# Patient Record
Sex: Male | Born: 1935 | Hispanic: No | Marital: Single
Health system: Southern US, Community
[De-identification: ages and names within clinical notes are randomized; demographics above are authoritative.]

---

## 2009-02-13 ENCOUNTER — Ambulatory Visit: Payer: Self-pay | Admitting: Family Medicine

## 2009-02-13 ENCOUNTER — Inpatient Hospital Stay: Payer: Self-pay | Admitting: Internal Medicine

## 2009-02-15 ENCOUNTER — Ambulatory Visit: Payer: Self-pay | Admitting: Cardiology

## 2009-03-15 ENCOUNTER — Inpatient Hospital Stay: Payer: Self-pay | Admitting: Internal Medicine

## 2009-08-27 ENCOUNTER — Ambulatory Visit: Payer: Self-pay | Admitting: Family Medicine

## 2009-09-24 ENCOUNTER — Inpatient Hospital Stay: Payer: Self-pay | Admitting: Internal Medicine

## 2010-06-09 ENCOUNTER — Encounter: Payer: Self-pay | Admitting: Neurology

## 2010-06-28 ENCOUNTER — Encounter: Payer: Self-pay | Admitting: Neurology

## 2011-07-24 ENCOUNTER — Ambulatory Visit: Payer: Self-pay | Admitting: Family Medicine

## 2012-09-09 ENCOUNTER — Inpatient Hospital Stay: Payer: Self-pay | Admitting: Internal Medicine

## 2012-09-09 LAB — COMPREHENSIVE METABOLIC PANEL
Alkaline Phosphatase: 135 U/L (ref 50–136)
BUN: 21 mg/dL — ABNORMAL HIGH (ref 7–18)
Bilirubin,Total: 0.5 mg/dL (ref 0.2–1.0)
Calcium, Total: 8.4 mg/dL — ABNORMAL LOW (ref 8.5–10.1)
Chloride: 112 mmol/L — ABNORMAL HIGH (ref 98–107)
Co2: 22 mmol/L (ref 21–32)
Creatinine: 1.53 mg/dL — ABNORMAL HIGH (ref 0.60–1.30)
EGFR (African American): 49 — ABNORMAL LOW
EGFR (Non-African Amer.): 42 — ABNORMAL LOW
Osmolality: 289 (ref 275–301)
Potassium: 4.1 mmol/L (ref 3.5–5.1)
SGOT(AST): 25 U/L (ref 15–37)
SGPT (ALT): 17 U/L (ref 12–78)

## 2012-09-09 LAB — CBC WITH DIFFERENTIAL/PLATELET
Basophil #: 0 10*3/uL (ref 0.0–0.1)
Eosinophil %: 0.5 %
HCT: 40.8 % (ref 40.0–52.0)
HGB: 13.5 g/dL (ref 13.0–18.0)
MCV: 85 fL (ref 80–100)
Monocyte #: 0.4 x10 3/mm (ref 0.2–1.0)
Monocyte %: 7 %
Neutrophil #: 4.3 10*3/uL (ref 1.4–6.5)
Neutrophil %: 79.1 %
Platelet: 148 10*3/uL — ABNORMAL LOW (ref 150–440)
RBC: 4.8 10*6/uL (ref 4.40–5.90)
RDW: 14.8 % — ABNORMAL HIGH (ref 11.5–14.5)

## 2012-09-09 LAB — TROPONIN I
Troponin-I: 0.07 ng/mL — ABNORMAL HIGH
Troponin-I: 0.14 ng/mL — ABNORMAL HIGH

## 2012-09-09 LAB — APTT: Activated PTT: 28.6 secs (ref 23.6–35.9)

## 2012-09-09 LAB — PROTIME-INR: Prothrombin Time: 13.7 secs (ref 11.5–14.7)

## 2012-09-09 LAB — CK TOTAL AND CKMB (NOT AT ARMC)
CK, Total: 119 U/L (ref 35–232)
CK-MB: 2.8 ng/mL (ref 0.5–3.6)

## 2012-09-11 LAB — CBC WITH DIFFERENTIAL/PLATELET
Basophil %: 0.3 %
Eosinophil #: 0 10*3/uL (ref 0.0–0.7)
Eosinophil %: 1.1 %
HGB: 12.3 g/dL — ABNORMAL LOW (ref 13.0–18.0)
Lymphocyte %: 18.9 %
Neutrophil %: 71 %
RBC: 4.45 10*6/uL (ref 4.40–5.90)
WBC: 3.6 10*3/uL — ABNORMAL LOW (ref 3.8–10.6)

## 2012-09-11 LAB — BASIC METABOLIC PANEL
BUN: 16 mg/dL (ref 7–18)
Calcium, Total: 8 mg/dL — ABNORMAL LOW (ref 8.5–10.1)
Co2: 25 mmol/L (ref 21–32)
EGFR (Non-African Amer.): 47 — ABNORMAL LOW
Glucose: 76 mg/dL (ref 65–99)
Osmolality: 283 (ref 275–301)
Sodium: 142 mmol/L (ref 136–145)

## 2013-10-21 IMAGING — CT CT HEAD WITHOUT CONTRAST
2 series · 15 of 30 positions shown, 19 images · non-contrast
Comparison: none

REASON FOR EXAM: cr [id number]  disorientation trouble walking
COMMENTS:

PROCEDURE:     CT  - CT HEAD WITHOUT CONTRAST  - July 24, 2011 [DATE]
RESULT:     Comparison:  09/24/2009
TECHNIQUE: Multiple axial images from the foramen magnum to the vertex were
obtained without IV contrast.

[Series 2: without · axial · non-contrast · 0.45mm/px · z∈[-110,+10]mm · 13 of 30 slices shown, 17 images]
[im 3/30  brain]
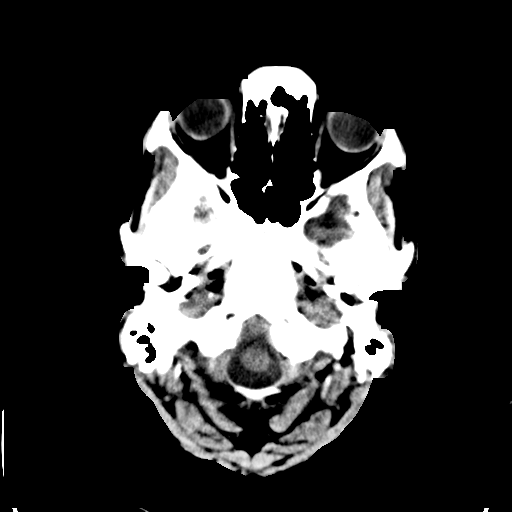
[im 3/30  bone]
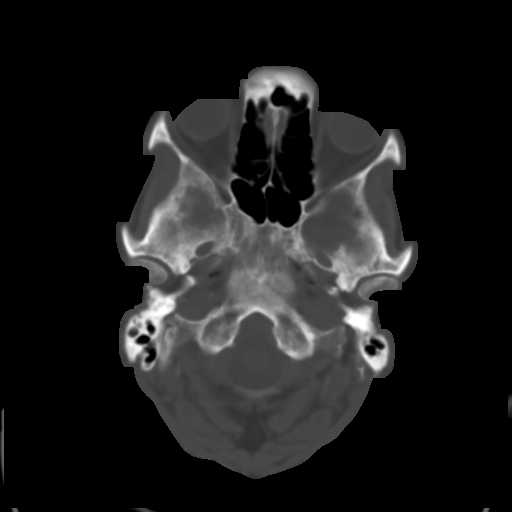
[im 5/30  brain]
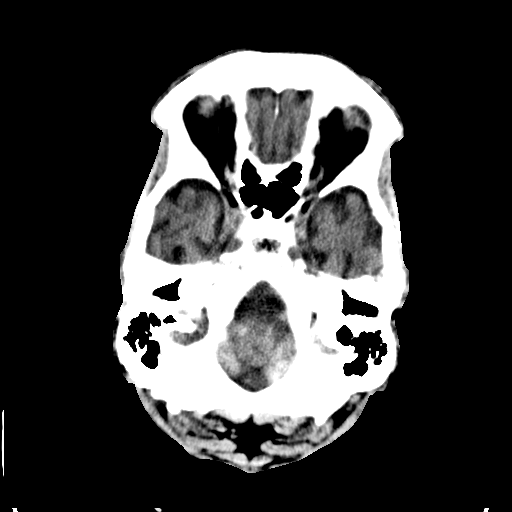
[im 7/30  brain]
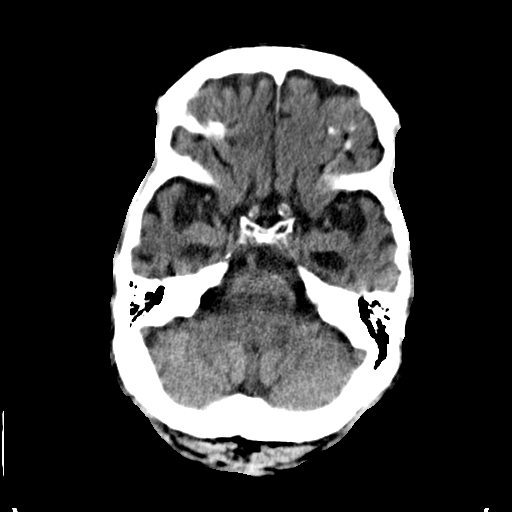
[im 9/30  brain]
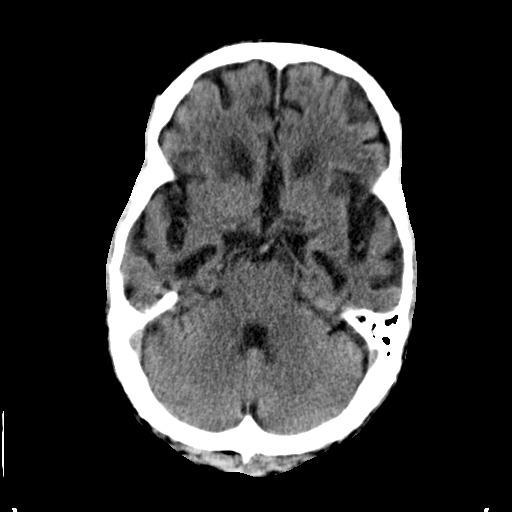
[im 11/30  brain]
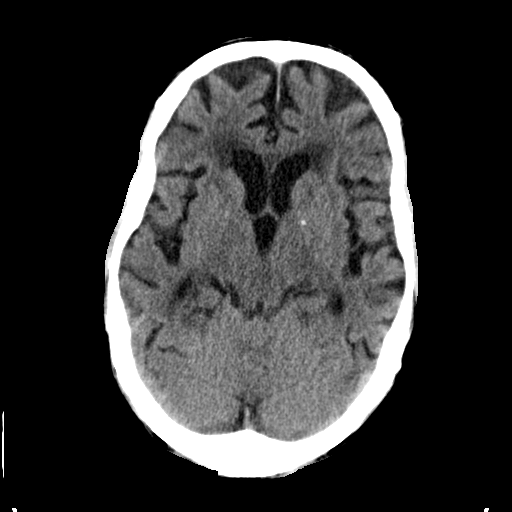
[im 11/30  bone]
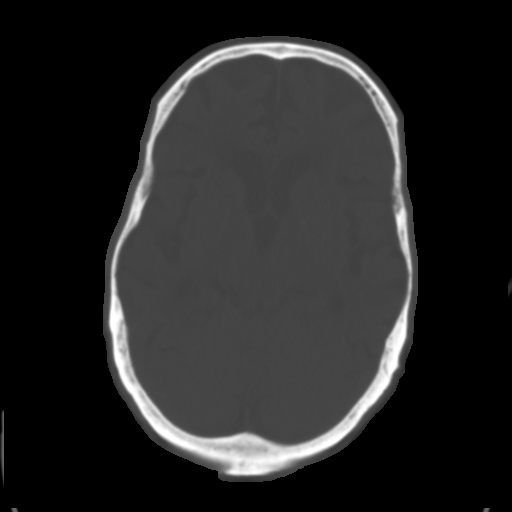
[im 13/30  brain]
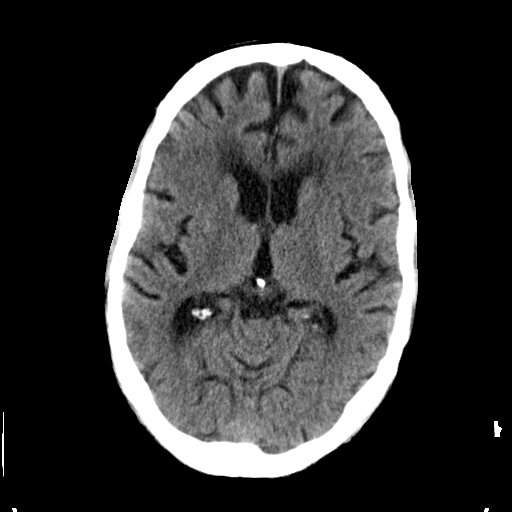
[im 15/30  brain]
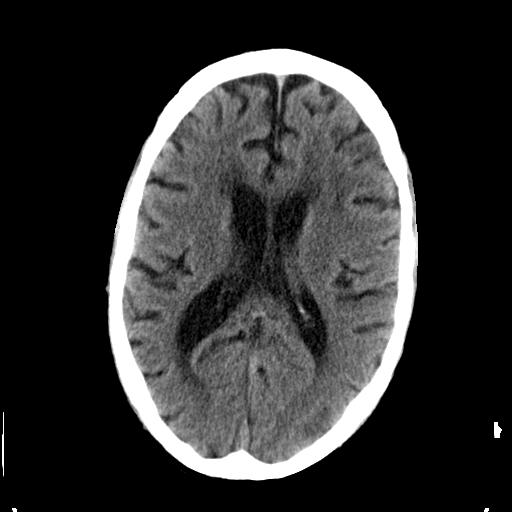
[im 17/30  brain]
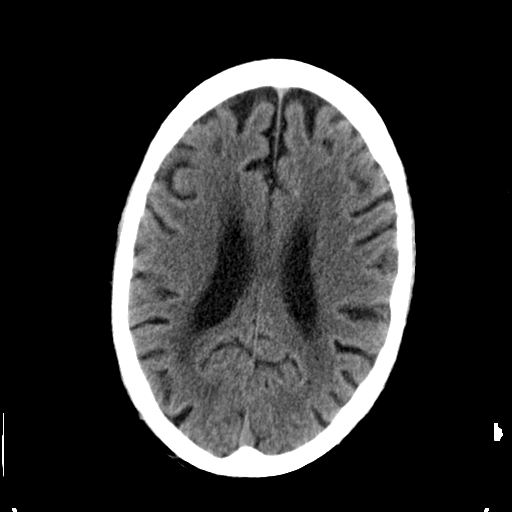
[im 19/30  brain]
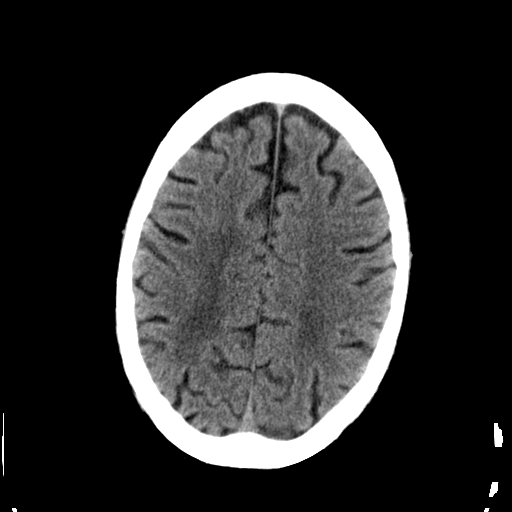
[im 19/30  bone]
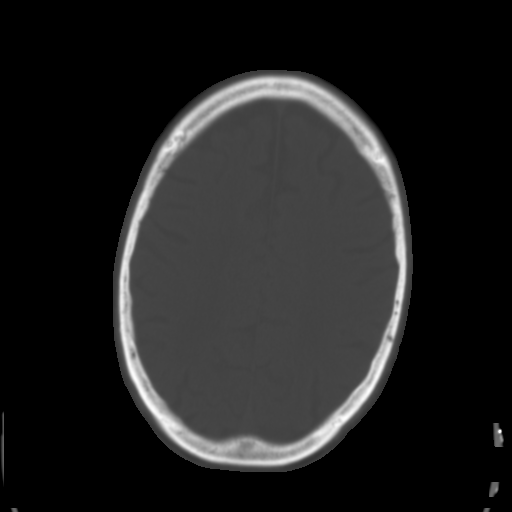
[im 21/30  brain]
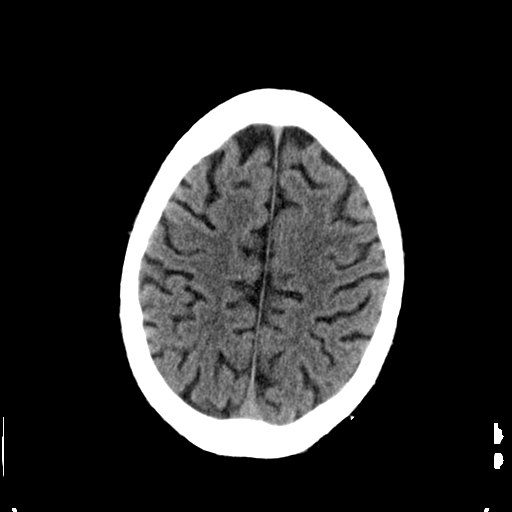
[im 23/30  brain]
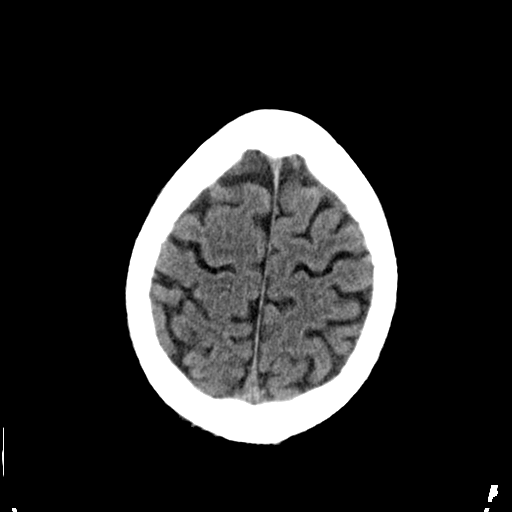
[im 25/30  brain]
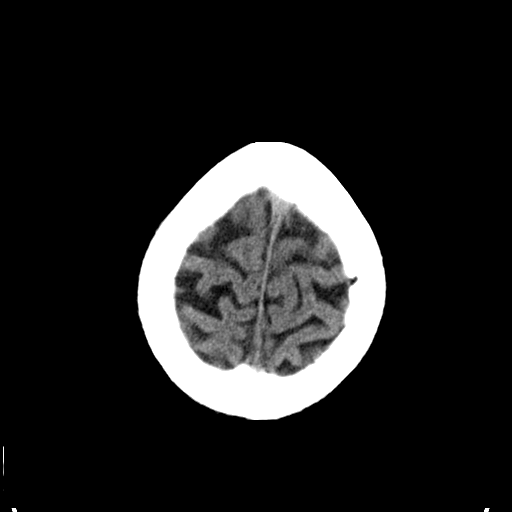
[im 27/30  brain]
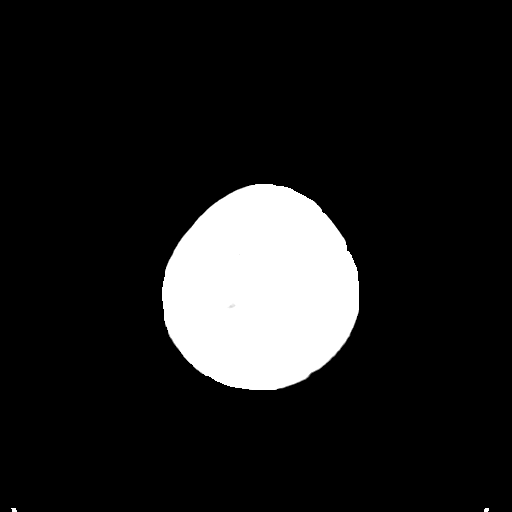
[im 27/30  bone]
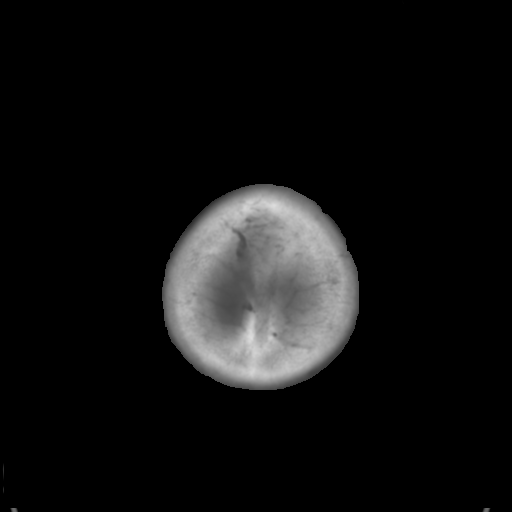

[Series 3: bone · axial · 0.45mm/px · z∈[-110,-90]mm · 2 of 30 slices shown]
[im 3/30  bone]
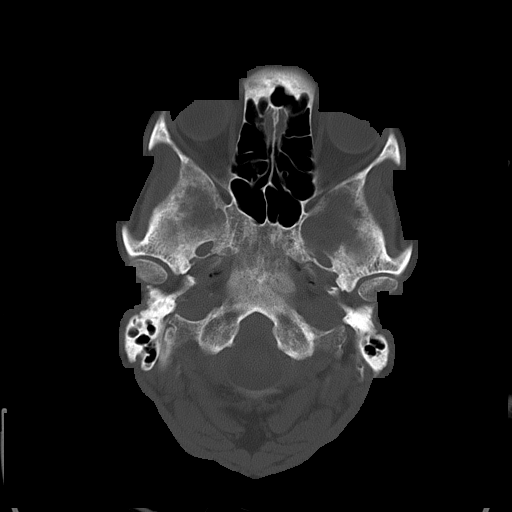
[im 7/30  bone]
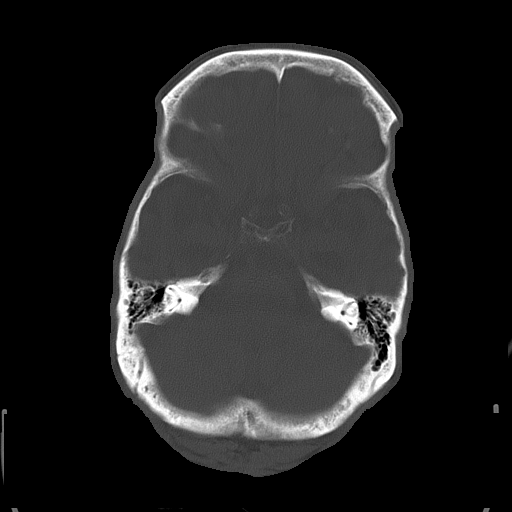

[15 of 30 positions shown; findings below may reference images not displayed]

FINDINGS: There is no evidence for mass effect, midline shift, or extra-axial fluid
collections. There is no evidence for space-occupying lesion, intracranial
hemorrhage, or cortical-based area of infarction. Periventricular and
subcortical hypoattenuation is consistent with chronic small vessel ischemic
disease.

The osseous structures are unremarkable.
IMPRESSION: 1. No acute intracranial process.
2. Chronic small vessel ischemic disease.

CT can underestimate ischemia in the first 24 hours after the event. If
there is clinical concern for an acute infarct, a followup MRI or repeat CT
scan in 24 hours may provide additional information.

## 2013-10-21 IMAGING — CR DG HUMERUS 2V *R*
1 series · 3 of 3 positions shown · non-contrast
Comparison: none

REASON FOR EXAM: right shoulder pain
COMMENTS:

PROCEDURE:     DXR - DXR HUMERUS RIGHT  - July 24, 2011 [DATE]
RESULT:     Right humerus images demonstrate a complex comminuted fracture
in the proximal humeral shaft and surgical neck region. Slight distraction
is present.

[Series 1: t humerus ap right · 0.14mm/px · 3 of 3 slices shown]
[im 1/3]
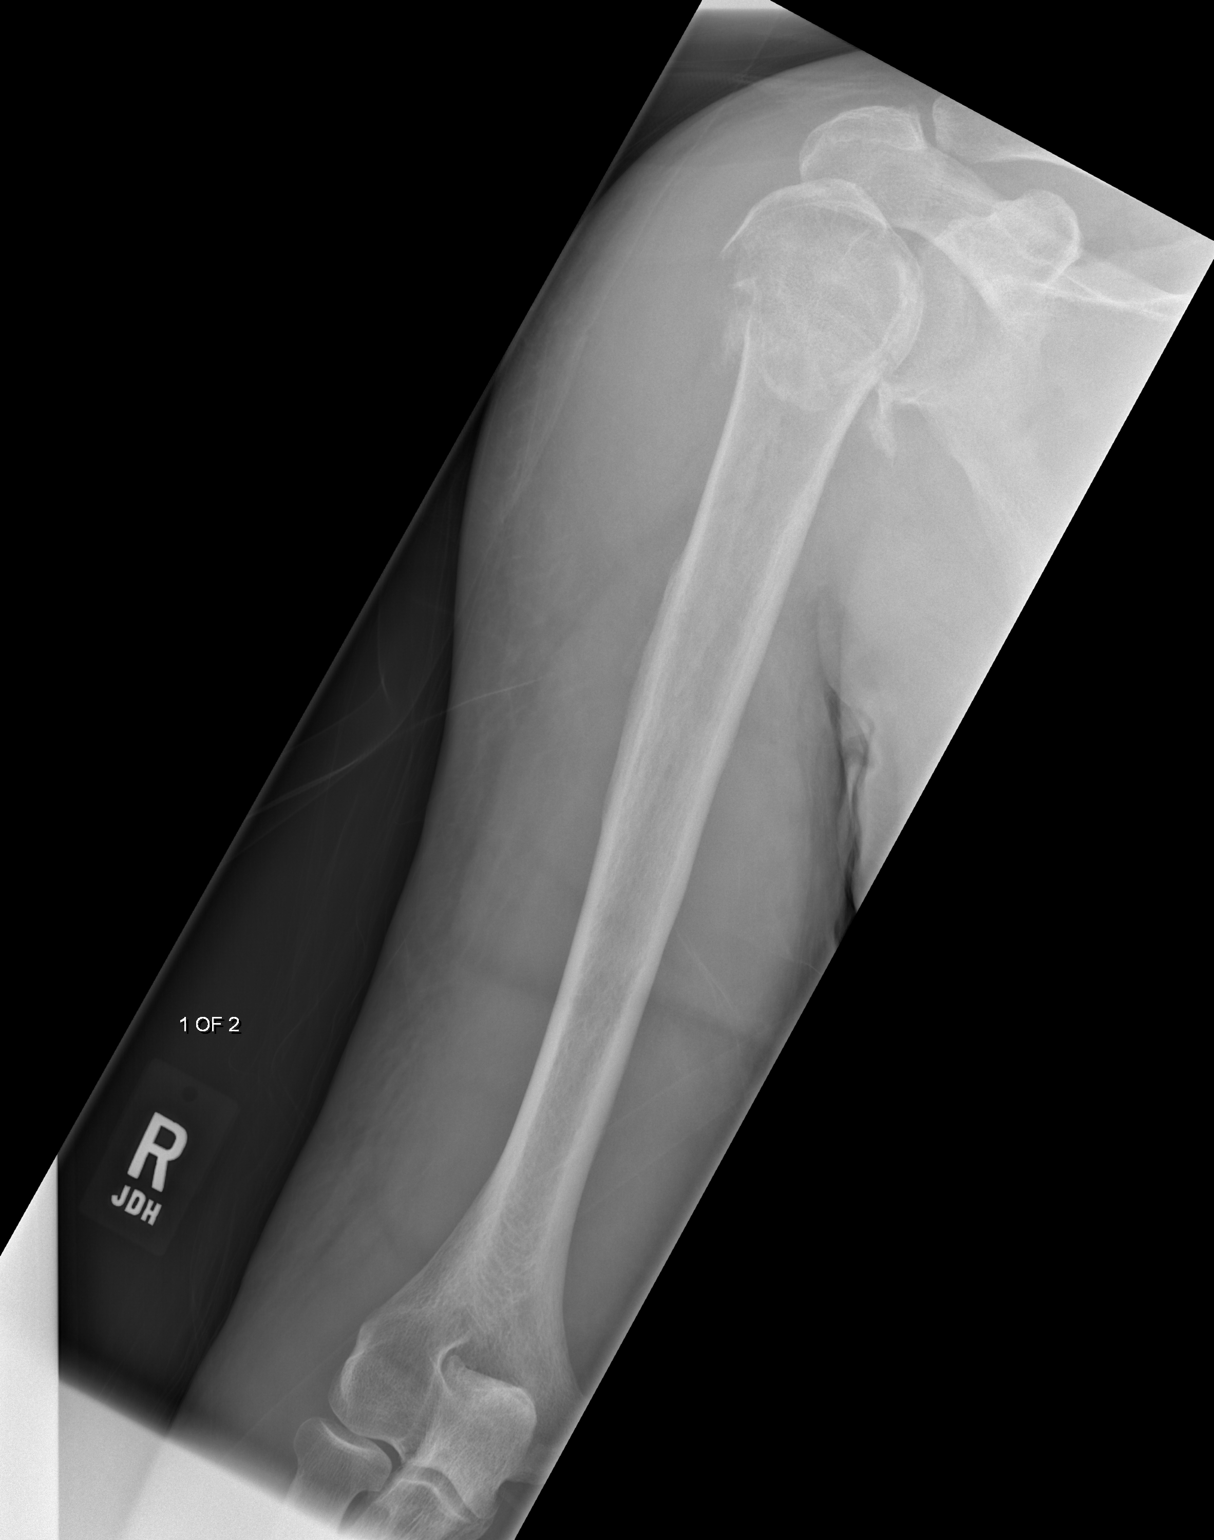
[im 2/3]
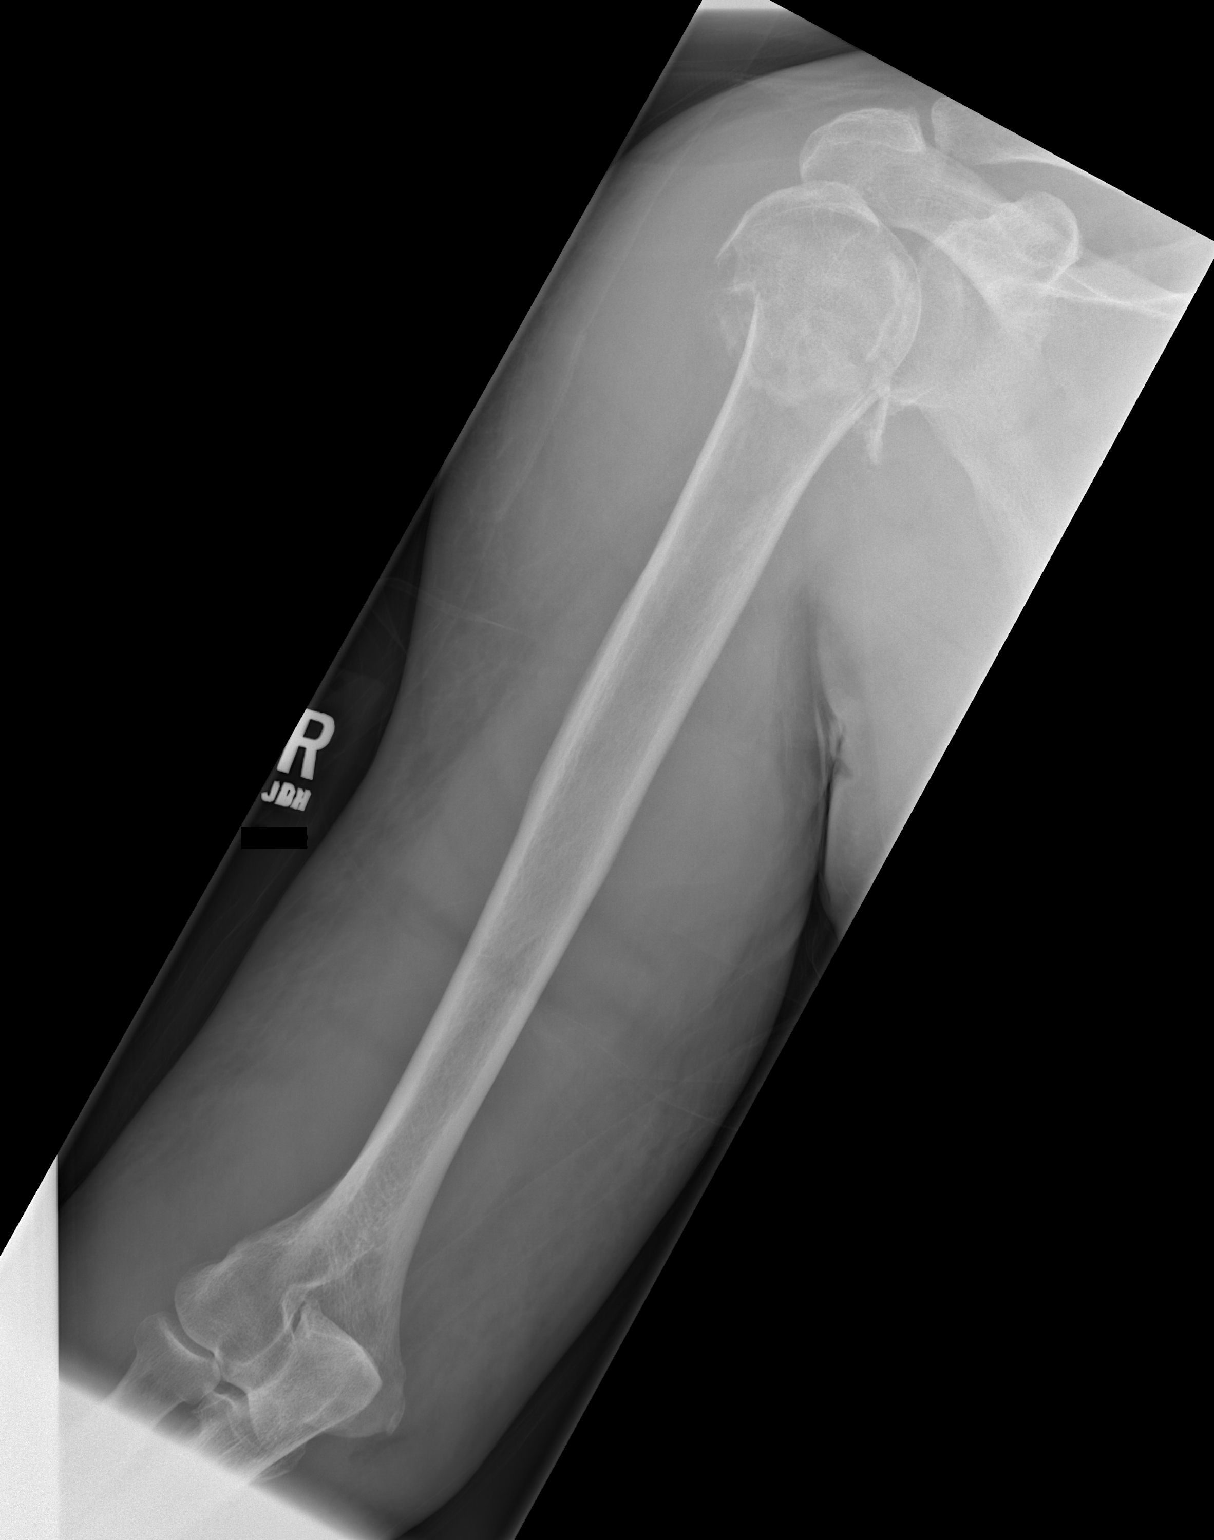
[im 3/3]
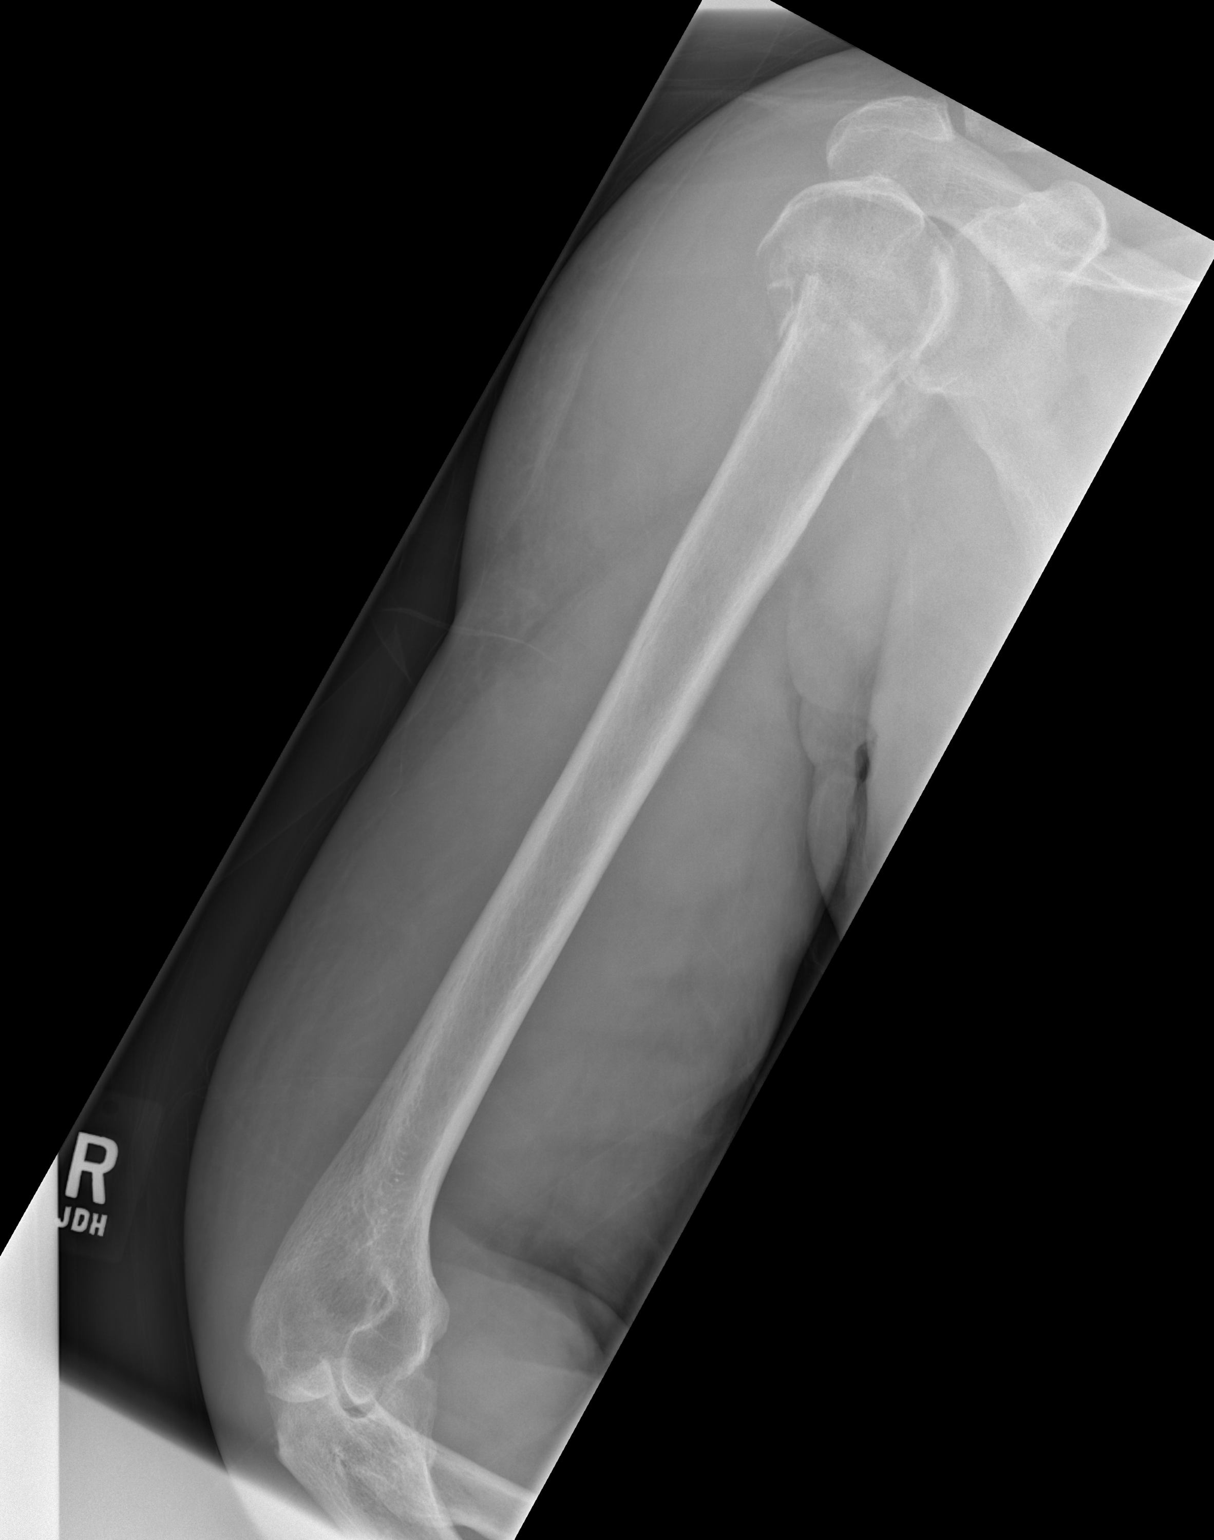

[3 of 3 positions shown; findings below may reference images not displayed]

IMPRESSION: Fracture with comminution in the proximal right humerus.

## 2014-12-08 IMAGING — CR DG CHEST 1V PORT
1 series · 1 of 1 positions shown · non-contrast
Comparison: none

REASON FOR EXAM: ams
COMMENTS:

PROCEDURE:     DXR - DXR PORTABLE CHEST SINGLE VIEW  - September 09, 2012  [DATE]
RESULT:     Stable cardiomegaly. No change from 09/28/2009. Lungs are clear.

[ap]
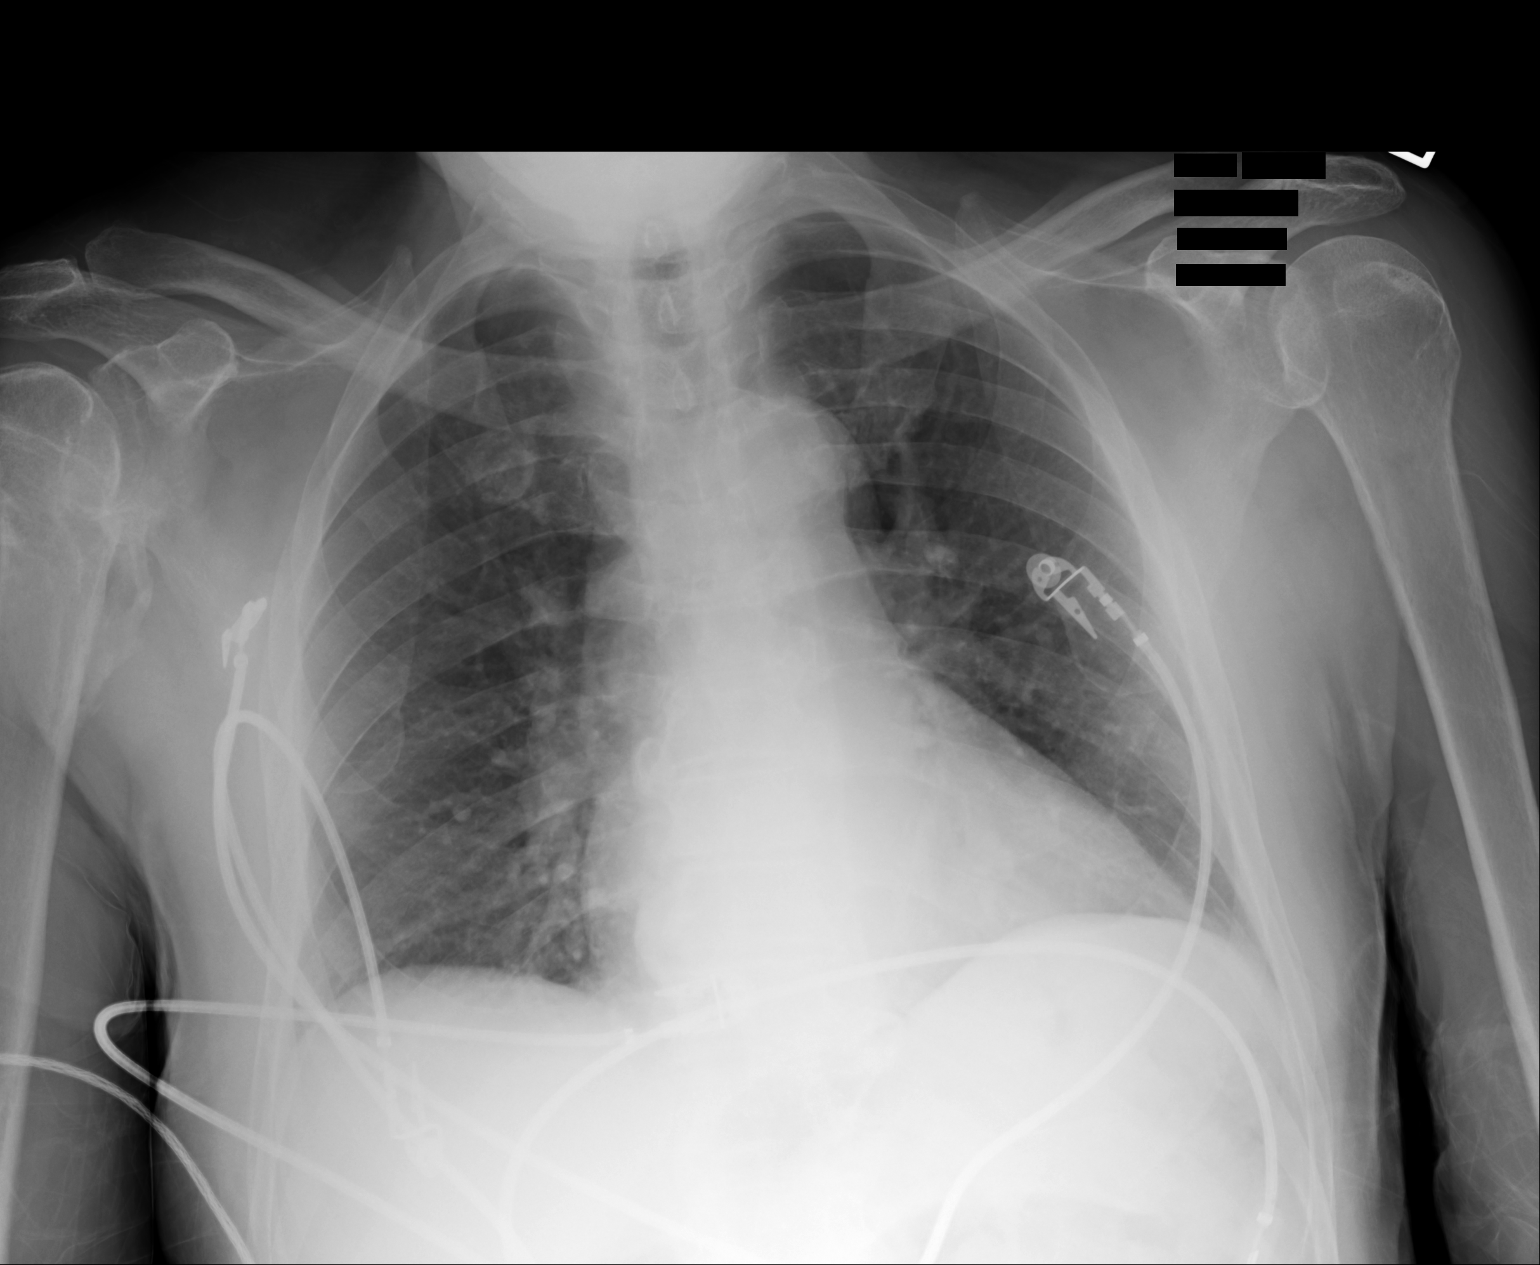

[1 of 1 positions shown; findings below may reference images not displayed]

IMPRESSION: Stable cardiomegaly. Also noted is deformity of the right
shoulder. This may be from prior or recent trauma.

## 2015-01-15 NOTE — Discharge Summary (Signed)
PATIENT NAME:  James Vaughn, Kemon MR#:  [id number] DATE OF BIRTH:  [birth year]  DATE OF ADMISSION:  09/09/2012 DATE OF DISCHARGE:  09/13/2012  DISCHARGED TO: Assisted living facility.   DISCHARGE DIAGNOSES:  1.  Hypothermia. 2.  Dehydration.  3.  Severe dementia.  4.  Acute renal failure.  5.  Elevated troponins.   IMAGING STUDIES: Includes: 1.  CT of the head without contrast, which showed no acute intracranial process.  2.  Chest x-ray showed no acute infiltrates or any cardiopulmonary process.   ADMITTING HISTORY AND PHYSICAL: Please see detailed history and physical dictated on 09/09/2012. In brief, an 81 year old African American male patient with history of dementia, atrial flutter, SVT, hypertension, who lives in a group home, who presented to the ER after he wandered off onto the street on a cold night and was found to be hypothermic and hypotensive and brought to the ER. In the ER, the patient was resuscitated with IV fluids and on rewarming. The patient improved well, but was hypotensive and was admitted to the hospitalist service for further work-up and treatment.   HOSPITAL COURSE:  1.  Hypothermia: This was secondary to the patient being exposed to cold temperatures for a prolonged time. This reversed quickly with rewarming.  2.  Hypertension: This was secondary to dehydration and resolved with the patient being started on IV fluids.  3.  Dehydration. The patient was severely dehydrated, likely secondary from dementia, decreased intake and resolved with IV fluids and on the day of discharge the patient is tolerating oral food and adequately hydrated.   The patient's case was discussed with his son, who has agreed to transfer the patient back to assisted living facility later when a bed is available. The patient will be moved to an Alzheimer's unit at [facility]. On the day of discharge, the patient's blood pressure is 151/57, pulse 68, temperature 97.6. He does have dementia, but  is communicating.   DISCHARGE MEDICATIONS: Include: 1.  Aspirin 81 mg oral once a day.  2.  Ferrous sulfate 325 mg 2 times a day with meals.  3.  Multivitamin 1 tablet once a day.  4.  Namenda 10 mg oral once a day.  5.  Donepezil 10 mg oral once a day.  6.  Omeprazole 20 mg oral once a day.  7.  Citalopram 10 mg oral once a day.   DISCHARGE INSTRUCTIONS: Continue physical therapy with home health. A regular diet with regular consistency. Activity as tolerated with assistance. Follow up with primary care physician in 1 to 2 weeks. The patient will be transferred to an Alzheimer's unit at [facility] a bed is available. This plan was discussed with the patient's son, who verbalized understanding and agreed with the plan.   TIME SPENT: On the day of discharge, on discharge activity was 35 minutes.    ____________________________ Molinda BailiffSrikar R. Hulet Ehrmann, MD srs:aw D: 09/13/2012 14:31:22 ET T: 09/14/2012 08:33:07 ET JOB#: [id number]  cc: Wardell HeathSrikar R. Eduardo Honor, MD, <Dictator> Orie FishermanSRIKAR R Gleen Ripberger MD ELECTRONICALLY SIGNED 09/14/2012 16:35

## 2015-01-15 NOTE — H&P (Signed)
PATIENT NAME:  James Vaughn, James Vaughn MR#:  [id number] DATE OF BIRTH:  [birth year]  DATE OF ADMISSION:  09/09/2012  PRIMARY CARE PHYSICIAN: Unknown but in the past was Dr. Mila Merry.   REFERRING PHYSICIAN: Dr. Suella Broad   CHIEF COMPLAINT: Hypothermia, atrial fibrillation with rapid rate, hypotension.   HISTORY OF PRESENT ILLNESS: James Vaughn is an 81 year old African American male with history of dementia, history of atrial flutter, supraventricular tachycardia, and systemic hypertension. The patient lives in a group home. He managed to wander out without supervision and left the area and then later was found about three blocks from the group home. EMS brought the patient to the hospital. He was found to be without his shirt and his pants were part way down. He had hypothermia. His temperature was 94 and he has tachycardia with atrial fibrillation with rapid rate. Upon arrival here he received warming treatment to combat his hypothermia. However, his blood pressure started to drop down. He also received a very small dose of IV metoprolol to control his tachycardia. His blood pressure dropped down to 70 systolic, however, the latest blood pressure is 85 to 90 systolic after IV hydration.   REVIEW OF SYSTEMS: 10 point system review was unobtainable due to the patient's advanced dementia.   PAST MEDICAL HISTORY:  1. Dementia. 2. Atrial flutter. 3. Supraventricular tachycardia.  4. History of pulmonary edema.  5. History of chronic kidney disease, stage III.  6. Systemic hypertension. 7. Hyperlipidemia. 8. GI bleed secondary to duodenal ulcer in the summer of 2010.   FAMILY HISTORY: Unobtainable from the patient but, according to the old records, he has no significant family history of coronary artery disease or diabetes.   SOCIAL HISTORY: Unobtainable from the patient due to dementia but he lives in a group home.   SOCIAL HABITS: According to the records, nonsmoker. No history of alcohol or  drug abuse.   ADMISSION MEDICATIONS: These are verified to group home that he is on:  1. Aricept 10 mg a day. 2. Namenda 10 mg a day.  3. Ferrous sulfate 325 mg a day. 4. Aspirin 81 mg a day.  5. Multivitamin once a day. 6. Omeprazole 20 mg twice a day. 7. Metoprolol 25 mg twice a day.   ALLERGIES: No known drug allergies.   PHYSICAL EXAMINATION:   VITAL SIGNS: Blood pressure 85 systolic over 60, respiratory rate 16, pulse 95. Temperature earlier was 94, now it is 97.7. Pulse oximetry 97%.   GENERAL APPEARANCE: Elderly male laying in bed in no acute distress, thin looking.   HEAD: No pallor. No icterus. No cyanosis.   ENT: Ear examination revealed no lesions, no discharge. Nasal mucosa was normal without discharge. No bleeding. No ulcers. Mouth examination was limited. Could not examine as the patient is refusing to open his mouth.   EYES: Eye examination also limited. The patient is closing his eyes and resists opening them.   NECK: Supple. Trachea at midline. No thyromegaly. No cervical lymphadenopathy. No masses.   HEART: Regular S1, S2. No S3, S4. No murmur. No gallop. No carotid bruits.   RESPIRATORY: Normal breathing pattern without use of accessory muscles. No rales. No wheezing.   ABDOMEN: Soft without tenderness. No rigidity. No hepatosplenomegaly. No masses. No hernias.   SKIN: No ulcers. No subcutaneous nodules.   MUSCULOSKELETAL: No joint swelling. No clubbing.   NEUROLOGIC: The patient is alert. He will respond to the question by mumbling his words. He will know his first name. Cranial nerves  II through XII are intact. No focal motor deficit.   PSYCHIATRIC: The patient has advanced dementia, does not make sense when he talks.   LABORATORY, DIAGNOSTIC, AND RADIOLOGICAL DATA: Chest x-ray showed no acute cardiopulmonary abnormalities. No effusion. No consolidation.   EKG showed atrial fibrillation with rapid ventricular rate at 117 per minute. Ventricular  premature complexes. Otherwise, unremarkable EKG.  Serum glucose 80, BUN 21, creatinine 1.5, sodium 144, potassium 4.1, calcium 8.4, albumin 3.1, total protein 6.4, total bilirubin 0.5, alkaline phosphatase 135, AST 25, ALT 17. CPK total 119 with normal CK-MB fraction 2.8, however, troponin was slightly elevated at 0.07. CBC showed white count 5000, hemoglobin 13, hematocrit 40, platelet count 148. Prothrombin time 13. INR 1. APTT 28.   ASSESSMENT:  1. Hypothermia secondary to patient being in the cold weather wandering about without shirt.  2. Atrial fibrillation with rapid ventricular rate. The patient does have history of atrial flutter in the past.  3. Hypotension probably precipitated by the rewarming process to combat his hypokalemia.  4. Mild elevation of troponin. This is a marginal elevation likely from demand ischemia probably precipitated by the mild tachycardia and cold exposure.   OTHER MEDICAL PROBLEMS:  1. Advanced dementia. 2. Chronic kidney disease  3. Past history of hyperlipidemia. 4. Hypertension. 5. History of GI bleed secondary to duodenal ulcer in 2010.   PLAN:  1. Will admit the patient to the Intensive Care Unit. 2. Gentle IV hydration.  3. I will hold the oral metoprolol 25 mg and may give very small dose intravenously at level of 2.5 mg if the heart rate is above 115.  4. Will resume the home medications as above.  5. Follow-up on the troponin for another series of two enzymes.   I could not get any information from the patient regarding LIVING WILL due to his advanced dementia.   TIME SPENT EVALUATING THIS PATIENT AND REVIEWING MEDICAL RECORDS: More than 55 minutes.   ____________________________ Carney CornersAmir M. Rudene Rearwish, MD amd:drc D: 09/09/2012 06:52:54 ET T: 09/09/2012 07:50:09 ET JOB#: [id number]  cc: Carney CornersAmir M. Rudene Rearwish, MD, <Dictator> Zollie ScaleAMIR M Lindbergh Winkles MD ELECTRONICALLY SIGNED 09/09/2012 22:43

## 2016-09-28 DEATH — deceased
